# Patient Record
Sex: Female | Born: 2000 | Race: Black or African American | Hispanic: No | Marital: Single | Smoking: Never smoker
Health system: Southern US, Community
[De-identification: ages and names within clinical notes are randomized; demographics above are authoritative.]

## PROBLEM LIST (undated history)

## (undated) DIAGNOSIS — J302 Other seasonal allergic rhinitis: Secondary | ICD-10-CM

---
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
## 2022-04-10 DIAGNOSIS — Z79899 Other long term (current) drug therapy: Secondary | ICD-10-CM | POA: Diagnosis not present

## 2022-04-10 DIAGNOSIS — L700 Acne vulgaris: Secondary | ICD-10-CM | POA: Diagnosis not present

## 2022-04-28 DIAGNOSIS — Z79899 Other long term (current) drug therapy: Secondary | ICD-10-CM | POA: Diagnosis not present

## 2022-05-09 DIAGNOSIS — H66001 Acute suppurative otitis media without spontaneous rupture of ear drum, right ear: Secondary | ICD-10-CM | POA: Diagnosis not present

## 2022-05-09 DIAGNOSIS — Z6833 Body mass index (BMI) 33.0-33.9, adult: Secondary | ICD-10-CM | POA: Diagnosis not present

## 2022-05-09 DIAGNOSIS — J029 Acute pharyngitis, unspecified: Secondary | ICD-10-CM | POA: Diagnosis not present

## 2022-05-12 DIAGNOSIS — Z79899 Other long term (current) drug therapy: Secondary | ICD-10-CM | POA: Diagnosis not present

## 2022-05-12 DIAGNOSIS — L700 Acne vulgaris: Secondary | ICD-10-CM | POA: Diagnosis not present

## 2022-06-09 DIAGNOSIS — L700 Acne vulgaris: Secondary | ICD-10-CM | POA: Diagnosis not present

## 2022-06-09 DIAGNOSIS — Z79899 Other long term (current) drug therapy: Secondary | ICD-10-CM | POA: Diagnosis not present

## 2022-07-10 DIAGNOSIS — L700 Acne vulgaris: Secondary | ICD-10-CM | POA: Diagnosis not present

## 2022-07-10 DIAGNOSIS — Z79899 Other long term (current) drug therapy: Secondary | ICD-10-CM | POA: Diagnosis not present

## 2022-07-20 DIAGNOSIS — Z79899 Other long term (current) drug therapy: Secondary | ICD-10-CM | POA: Diagnosis not present

## 2022-08-05 DIAGNOSIS — L700 Acne vulgaris: Secondary | ICD-10-CM | POA: Diagnosis not present

## 2022-08-05 DIAGNOSIS — L218 Other seborrheic dermatitis: Secondary | ICD-10-CM | POA: Diagnosis not present

## 2022-08-05 DIAGNOSIS — Z79899 Other long term (current) drug therapy: Secondary | ICD-10-CM | POA: Diagnosis not present

## 2022-09-03 DIAGNOSIS — L853 Xerosis cutis: Secondary | ICD-10-CM | POA: Diagnosis not present

## 2022-09-03 DIAGNOSIS — L700 Acne vulgaris: Secondary | ICD-10-CM | POA: Diagnosis not present

## 2022-09-03 DIAGNOSIS — Z79899 Other long term (current) drug therapy: Secondary | ICD-10-CM | POA: Diagnosis not present

## 2022-10-05 DIAGNOSIS — L732 Hidradenitis suppurativa: Secondary | ICD-10-CM | POA: Diagnosis not present

## 2022-10-05 DIAGNOSIS — Z79899 Other long term (current) drug therapy: Secondary | ICD-10-CM | POA: Diagnosis not present

## 2022-10-05 DIAGNOSIS — L853 Xerosis cutis: Secondary | ICD-10-CM | POA: Diagnosis not present

## 2022-10-05 DIAGNOSIS — L700 Acne vulgaris: Secondary | ICD-10-CM | POA: Diagnosis not present

## 2022-10-23 DIAGNOSIS — R7309 Other abnormal glucose: Secondary | ICD-10-CM | POA: Diagnosis not present

## 2022-10-23 DIAGNOSIS — Z9109 Other allergy status, other than to drugs and biological substances: Secondary | ICD-10-CM | POA: Diagnosis not present

## 2022-10-23 DIAGNOSIS — Z13220 Encounter for screening for lipoid disorders: Secondary | ICD-10-CM | POA: Diagnosis not present

## 2022-10-23 DIAGNOSIS — L709 Acne, unspecified: Secondary | ICD-10-CM | POA: Diagnosis not present

## 2022-10-23 DIAGNOSIS — Z79899 Other long term (current) drug therapy: Secondary | ICD-10-CM | POA: Diagnosis not present

## 2022-10-23 DIAGNOSIS — Z0000 Encounter for general adult medical examination without abnormal findings: Secondary | ICD-10-CM | POA: Diagnosis not present

## 2022-10-23 DIAGNOSIS — R946 Abnormal results of thyroid function studies: Secondary | ICD-10-CM | POA: Diagnosis not present

## 2022-10-28 DIAGNOSIS — Z91018 Allergy to other foods: Secondary | ICD-10-CM | POA: Diagnosis not present

## 2022-10-28 DIAGNOSIS — J3089 Other allergic rhinitis: Secondary | ICD-10-CM | POA: Diagnosis not present

## 2022-10-28 DIAGNOSIS — J3081 Allergic rhinitis due to animal (cat) (dog) hair and dander: Secondary | ICD-10-CM | POA: Diagnosis not present

## 2022-10-28 DIAGNOSIS — J301 Allergic rhinitis due to pollen: Secondary | ICD-10-CM | POA: Diagnosis not present

## 2022-11-04 DIAGNOSIS — Z79899 Other long term (current) drug therapy: Secondary | ICD-10-CM | POA: Diagnosis not present

## 2022-11-04 DIAGNOSIS — L732 Hidradenitis suppurativa: Secondary | ICD-10-CM | POA: Diagnosis not present

## 2022-11-04 DIAGNOSIS — L700 Acne vulgaris: Secondary | ICD-10-CM | POA: Diagnosis not present

## 2022-12-09 DIAGNOSIS — Z79899 Other long term (current) drug therapy: Secondary | ICD-10-CM | POA: Diagnosis not present

## 2022-12-09 DIAGNOSIS — L700 Acne vulgaris: Secondary | ICD-10-CM | POA: Diagnosis not present

## 2022-12-09 DIAGNOSIS — L720 Epidermal cyst: Secondary | ICD-10-CM | POA: Diagnosis not present

## 2022-12-09 DIAGNOSIS — L74510 Primary focal hyperhidrosis, axilla: Secondary | ICD-10-CM | POA: Diagnosis not present

## 2022-12-19 DIAGNOSIS — M79671 Pain in right foot: Secondary | ICD-10-CM | POA: Diagnosis not present

## 2023-02-05 DIAGNOSIS — Z111 Encounter for screening for respiratory tuberculosis: Secondary | ICD-10-CM | POA: Diagnosis not present

## 2023-02-08 DIAGNOSIS — Z111 Encounter for screening for respiratory tuberculosis: Secondary | ICD-10-CM | POA: Diagnosis not present

## 2023-02-08 DIAGNOSIS — Z681 Body mass index (BMI) 19 or less, adult: Secondary | ICD-10-CM | POA: Diagnosis not present

## 2023-02-08 DIAGNOSIS — Z021 Encounter for pre-employment examination: Secondary | ICD-10-CM | POA: Diagnosis not present

## 2023-02-08 DIAGNOSIS — Z6835 Body mass index (BMI) 35.0-35.9, adult: Secondary | ICD-10-CM | POA: Diagnosis not present

## 2023-05-22 DIAGNOSIS — H66002 Acute suppurative otitis media without spontaneous rupture of ear drum, left ear: Secondary | ICD-10-CM | POA: Diagnosis not present

## 2023-05-22 DIAGNOSIS — Z6833 Body mass index (BMI) 33.0-33.9, adult: Secondary | ICD-10-CM | POA: Diagnosis not present

## 2023-05-22 DIAGNOSIS — Z23 Encounter for immunization: Secondary | ICD-10-CM | POA: Diagnosis not present

## 2023-05-22 DIAGNOSIS — R051 Acute cough: Secondary | ICD-10-CM | POA: Diagnosis not present

## 2023-06-11 NOTE — Progress Notes (Deleted)
  [facility]ARE [facility] [address] [place] [address] Dept: [phone] Dept Fax: [phone]  New Patient Office Visit  Subjective:   Christie Obrien [birth year] 06/14/2023  No chief complaint on file.   HPI: Christie Obrien presents today to establish care at [facility]. Introduced to publishing rights manager role and practice setting.  All questions answered.  Concerns: See below   Discussed the use of AI scribe software for clinical note transcription with the patient, who gave verbal consent to proceed.  History of Present Illness             The following portions of the patient's history were reviewed and updated as appropriate: past medical history, past surgical history, family history, social history, allergies, medications, and problem list.   There are no active problems to display for this patient.  Past Medical History:  Diagnosis Date   Seasonal allergies    No past surgical history on file. No family history on file.  Current Outpatient Medications:    Cetirizine HCl (ZYRTEC PO), Take by mouth., Disp: , Rfl:    EPINEPHrine 0.3 mg/0.3 mL IJ SOAJ injection, Inject into the muscle once., Disp: , Rfl:    fluconazole  (DIFLUCAN ) 150 MG tablet, Take 1 capsule as needed for vaginal yeast infection.  May repeat in 3 days if symptoms persist., Disp: 2 tablet, Rfl: 0   meloxicam (MOBIC) 7.5 MG tablet, Take 7.5 mg by mouth daily., Disp: , Rfl:    Montelukast Sodium (SINGULAIR PO), Take by mouth., Disp: , Rfl:  Allergies  Allergen Reactions   Other     Tree nuts   Peanut-Containing Drug Products     hives    ROS: A complete ROS was performed with pertinent positives/negatives noted in the HPI. The remainder of the ROS are negative.   Objective:   There were no vitals filed for this visit.  GENERAL: Well-appearing, in NAD. Well nourished.  SKIN: Pink, warm and dry. No rash, lesion,  ulceration, or ecchymoses.  NECK: Trachea midline. Full ROM w/o pain or tenderness. No lymphadenopathy.  RESPIRATORY: Chest wall symmetrical. Respirations even and non-labored. Breath sounds clear to auscultation bilaterally.  CARDIAC: S1, S2 present, regular rate and rhythm. Peripheral pulses 2+ bilaterally.  MSK: Muscle tone and strength appropriate for age. Joints w/o tenderness, redness, or swelling.  EXTREMITIES: Without clubbing, cyanosis, or edema.  NEUROLOGIC: No motor or sensory deficits. Steady, even gait.  PSYCH/MENTAL STATUS: Alert, oriented x 3. Cooperative, appropriate mood and affect.   Health Maintenance Due  Topic Date Due   COVID-19 Vaccine (1) Never done   HPV VACCINES (1 - 3-dose series) Never done   HIV Screening  Never done   Hepatitis C Screening  Never done   DTaP/Tdap/Td (1 - Tdap) Never done    No results found for any visits on 06/14/23.  Assessment & Plan:  Assessment and Plan               There are no diagnoses linked to this encounter.  No orders of the defined types were placed in this encounter.  No orders of the defined types were placed in this encounter.   No follow-ups on file.   Rosina Senters, FNP

## 2023-06-14 ENCOUNTER — Ambulatory Visit: Payer: Federal, State, Local not specified - PPO | Admitting: Internal Medicine

## 2023-08-17 ENCOUNTER — Encounter: Payer: Self-pay | Admitting: Internal Medicine

## 2023-08-17 ENCOUNTER — Ambulatory Visit: Payer: Federal, State, Local not specified - PPO | Admitting: Internal Medicine

## 2023-08-17 VITALS — BP 108/80 | HR 93 | Temp 98.0°F | Ht 63.0 in | Wt 192.8 lb

## 2023-08-17 DIAGNOSIS — J302 Other seasonal allergic rhinitis: Secondary | ICD-10-CM | POA: Diagnosis not present

## 2023-08-17 DIAGNOSIS — Z91010 Allergy to peanuts: Secondary | ICD-10-CM | POA: Insufficient documentation

## 2023-08-17 DIAGNOSIS — E669 Obesity, unspecified: Secondary | ICD-10-CM | POA: Diagnosis not present

## 2023-08-17 NOTE — Progress Notes (Signed)
 [facility] [address] [place] [address] Dept: [phone] Dept Fax: [phone]  New Patient Office Visit  Subjective:   Christie Obrien April 02, 2001 08/17/2023  Chief Complaint  Patient presents with   Establish Care    HPI: Christie Obrien presents today to establish care at [facility]. Introduced to Publishing rights manager role and practice setting.  All questions answered.  Concerns: See below   Discussed the use of AI scribe software for clinical note transcription with the patient, who gave verbal consent to proceed.  History of Present Illness   The patient, with a history of seasonal allergies and peanut allergy, presents to establish care. She is currently managed with Zyrtec and Singulair for her seasonal allergies and has an EpiPen for her peanut allergy. She sees an allergist regularly for these conditions. She has no history of surgeries. She does not smoke. She has been focusing on weight loss, losing eight pounds since January through diet and exercise. She is satisfied with her progress and plans to continue her current regimen.       The following portions of the patient's history were reviewed and updated as appropriate: past medical history, past surgical history, family history, social history, allergies, medications, and problem list.   There are no active problems to display for this patient.  Past Medical History:  Diagnosis Date   Seasonal allergies    History reviewed. No pertinent surgical history. History reviewed. No pertinent family history.  Current Outpatient Medications:    Cetirizine HCl (ZYRTEC PO), Take by mouth., Disp: , Rfl:    EPINEPHrine 0.3 mg/0.3 mL IJ SOAJ injection, Inject into the muscle once., Disp: , Rfl:    Montelukast Sodium (SINGULAIR PO), Take by mouth., Disp: , Rfl:  Allergies  Allergen Reactions   Other     Tree nuts   Peanut-Containing  Drug Products     hives    ROS: A complete ROS was performed with pertinent positives/negatives noted in the HPI. The remainder of the ROS are negative.   Objective:   Today's Vitals   08/17/23 0900  BP: 108/80  Pulse: 93  Temp: 98 F (36.7 C)  TempSrc: Temporal  SpO2: 100%  Weight: 192 lb 12.8 oz (87.5 kg)  Height: 5\' 3"  (1.6 m)    GENERAL: Well-appearing, in NAD. Well nourished.  SKIN: Pink, warm and dry. No rash, lesion, ulceration, or ecchymoses.  NECK: Trachea midline. Full ROM w/o pain or tenderness. No lymphadenopathy.  RESPIRATORY: Chest wall symmetrical. Respirations even and non-labored. Breath sounds clear to auscultation bilaterally.  CARDIAC: S1, S2 present, regular rate and rhythm. Peripheral pulses 2+ bilaterally.  EXTREMITIES: Without clubbing, cyanosis, or edema.  NEUROLOGIC: Steady, even gait.  PSYCH/MENTAL STATUS: Alert, oriented x 3. Cooperative, appropriate mood and affect.   Health Maintenance Due  Topic Date Due   HIV Screening  Never done   Hepatitis C Screening  Never done    No results found for any visits on 08/17/23.  Assessment & Plan:  Assessment and Plan    Obesity  - Continue healthy diet and regular exercise  - Discussed gradual weight loss and healthy lifestyle    Seasonal Allergies Seasonal allergies controlled with Zyrtec and Singulair. No exacerbations. - Continue Zyrtec. - Continue Singulair. - Follow up with allergist.  Peanut Allergy Peanut allergy managed with EpiPen. No recent reactions. - Epipen is not expired. No refill needed.  - Follow up with allergist in May.  No orders of the defined types were placed in this encounter.  No orders of the defined types were placed in this encounter.   Return in about 3 months (around 11/17/2023) for Annual Physical Exam with fasting lab work.   Christie Decent, FNP

## 2023-10-19 DIAGNOSIS — J3089 Other allergic rhinitis: Secondary | ICD-10-CM | POA: Diagnosis not present

## 2023-10-19 DIAGNOSIS — J301 Allergic rhinitis due to pollen: Secondary | ICD-10-CM | POA: Diagnosis not present

## 2023-10-19 DIAGNOSIS — Z91018 Allergy to other foods: Secondary | ICD-10-CM | POA: Diagnosis not present

## 2023-10-19 DIAGNOSIS — J3081 Allergic rhinitis due to animal (cat) (dog) hair and dander: Secondary | ICD-10-CM | POA: Diagnosis not present

## 2023-11-17 ENCOUNTER — Encounter: Payer: Self-pay | Admitting: Internal Medicine

## 2023-11-17 ENCOUNTER — Ambulatory Visit (INDEPENDENT_AMBULATORY_CARE_PROVIDER_SITE_OTHER): Admitting: Internal Medicine

## 2023-11-17 VITALS — BP 116/66 | HR 84 | Temp 98.1°F | Ht 63.0 in | Wt 186.0 lb

## 2023-11-17 DIAGNOSIS — Z0000 Encounter for general adult medical examination without abnormal findings: Secondary | ICD-10-CM

## 2023-11-17 DIAGNOSIS — T753XXA Motion sickness, initial encounter: Secondary | ICD-10-CM

## 2023-11-17 DIAGNOSIS — Z23 Encounter for immunization: Secondary | ICD-10-CM

## 2023-11-17 MED ORDER — ONDANSETRON 4 MG PO TBDP: 4.0000 mg | ORAL_TABLET | Freq: Three times a day (TID) | ORAL | 0 refills | Status: AC | PRN

## 2023-11-17 NOTE — Progress Notes (Signed)
 Subjective:   Christie Obrien 12-27-2000  11/17/2023   CC: Chief Complaint  Patient presents with   Annual Exam    HPI: Christie Obrien is a 23 y.o. female who presents for a routine health maintenance exam.  Labs collected at time of visit.   Patient reports motion sickness on long car rides. She is going out of town later this month. Will be using car and airline transportation. Would like to know what to take for motion sickness.   HEALTH SCREENINGS: - Pap smear: declined today  - Mammogram (40+): Not applicable  - Colonoscopy (45+): Not applicable  - Bone Density (65+): Not applicable  - Lung CA screening with low-dose CT:  Not applicable Adults age 60-80 who are current cigarette smokers or quit within the last 15 years. Must have 20 pack year history.   Depression and Anxiety Screen done today and results listed below:     08/17/2023    9:22 AM  Depression screen PHQ 2/9  Decreased Interest 0  Down, Depressed, Hopeless 0  PHQ - 2 Score 0  Altered sleeping 0  Tired, decreased energy 0  Change in appetite 0  Feeling bad or failure about yourself  0  Trouble concentrating 0  Moving slowly or fidgety/restless 0  Suicidal thoughts 0  PHQ-9 Score 0  Difficult doing work/chores Not difficult at all      08/17/2023    9:22 AM  GAD 7 : Generalized Anxiety Score  Nervous, Anxious, on Edge 0  Control/stop worrying 1  Worry too much - different things 1  Trouble relaxing 0  Restless 0  Easily annoyed or irritable 0  Afraid - awful might happen 0  Total GAD 7 Score 2  Anxiety Difficulty Not difficult at all    IMMUNIZATIONS: - Tdap: Tetanus vaccination status reviewed: Td vaccination indicated and given today. - HPV: Up to date - Influenza: Up to date   Past medical history, surgical history, medications, allergies, family history and social history reviewed with patient today and changes made to appropriate areas of the chart.   Past Medical History:   Diagnosis Date   Seasonal allergies     History reviewed. No pertinent surgical history.  Current Outpatient Medications on File Prior to Visit  Medication Sig   Cetirizine HCl (ZYRTEC PO) Take by mouth.   EPINEPHrine 0.3 mg/0.3 mL IJ SOAJ injection Inject into the muscle once.   Montelukast Sodium (SINGULAIR PO) Take by mouth.   Oxymetazoline HCl (NASAL SPRAY NA) Place 1 spray into the nose at bedtime.   spironolactone (ALDACTONE) 50 MG tablet Take 50 mg by mouth daily.   No current facility-administered medications on file prior to visit.    Allergies  Allergen Reactions   Other     Tree nuts   Peanut-Containing Drug Products     hives     Social History   Socioeconomic History   Marital status: Single    Spouse name: Not on file   Number of children: Not on file   Years of education: Not on file   Highest education level: Some college, no degree  Occupational History   Not on file  Tobacco Use   Smoking status: Never   Smokeless tobacco: Never  Vaping Use   Vaping status: Never Used  Substance and Sexual Activity   Alcohol use: Yes    Comment: rarely   Drug use: Never   Sexual activity: Never  Other Topics Concern   Not on  file  Social History Narrative   Not on file   Social Drivers of Health   Financial Resource Strain: Low Risk  (06/11/2023)   Overall Financial Resource Strain (CARDIA)    Difficulty of Paying Living Expenses: Not hard at all  Food Insecurity: No Food Insecurity (06/11/2023)   Hunger Vital Sign    Worried About Running Out of Food in the Last Year: Never true    Ran Out of Food in the Last Year: Never true  Transportation Needs: No Transportation Needs (06/11/2023)   PRAPARE - Administrator, Civil Service (Medical): No    Lack of Transportation (Non-Medical): No  Physical Activity: Insufficiently Active (06/11/2023)   Exercise Vital Sign    Days of Exercise per Week: 5 days    Minutes of Exercise per Session: 20 min   Stress: Stress Concern Present (06/11/2023)   [employer] of Occupational Health - Occupational Stress Questionnaire    Feeling of Stress : To some extent  Social Connections: Moderately Integrated (06/11/2023)   Social Connection and Isolation Panel [NHANES]    Frequency of Communication with Friends and Family: More than three times a week    Frequency of Social Gatherings with Friends and Family: Once a week    Attends Religious Services: More than 4 times per year    Active Member of [employer] or Organizations: Yes    Attends Engineer, structural: More than 4 times per year    Marital Status: Never married  Catering manager Violence: Not on file   Social History   Tobacco Use  Smoking Status Never  Smokeless Tobacco Never   Social History   Substance and Sexual Activity  Alcohol Use Yes   Comment: rarely    History reviewed. No pertinent family history.   ROS: Denies fever, fatigue, unexplained weight loss/gain, hearing or vision changes, cardiac or respiratory complaints. Denies neurological deficits, musculoskeletal complaints, gastrointestinal or genitourinary complaints, mental health complaints, and skin changes.   Objective:   Today's Vitals   11/17/23 0857  BP: 116/66  Pulse: 84  Temp: 98.1 F (36.7 C)  TempSrc: Oral  SpO2: 98%  Weight: 186 lb (84.4 kg)  Height: 5' 3 (1.6 m)    GENERAL APPEARANCE: Well-appearing, in NAD. Well nourished.  SKIN: Pink, warm and dry. Turgor normal. No rash, lesion, ulceration, or ecchymoses. Hair evenly distributed.  HEENT: HEAD: Normocephalic.  EYES: PERRLA. EOMI. Lids intact w/o defect. Sclera white, Conjunctiva pink w/o exudate.  EARS: External ear w/o redness, swelling, masses or lesions. EAC clear. TM's intact, translucent w/o bulging, appropriate landmarks visualized. Appropriate acuity to conversational tones.  NOSE: Septum midline w/o deformity. Nares patent, mucosa pink and non-inflamed w/o drainage.   THROAT: Uvula midline. Oropharynx clear. Tonsils non-inflamed w/o exudate. Oral mucosa pink and moist.  NECK: Supple, Trachea midline. Full ROM w/o pain or tenderness. No lymphadenopathy. Thyroid non-tender w/o enlargement or palpable masses.  RESPIRATORY: Chest wall symmetrical w/o masses. Respirations even and non-labored. Breath sounds clear to auscultation bilaterally. No wheezes, rales, rhonchi, or crackles. CARDIAC: S1, S2 present, regular rate and rhythm. No gallops, murmurs, rubs, or clicks. Capillary refill <2 seconds. Peripheral pulses 2+ bilaterally. GI: Abdomen soft w/o distention. Normoactive bowel sounds. No palpable masses or tenderness. No guarding or rebound tenderness. Liver and spleen w/o tenderness or enlargement. No CVA tenderness.  MSK: Muscle tone and strength appropriate for age, w/o atrophy or abnormal movement.  EXTREMITIES: Active ROM intact, w/o tenderness, crepitus, or contracture. No obvious  joint deformities or effusions. No clubbing, edema, or cyanosis.  NEUROLOGIC: CN's II-XII intact. Motor strength symmetrical with no obvious weakness. No sensory deficits. Steady, even gait.  PSYCH/MENTAL STATUS: Alert, oriented x 3. Cooperative, appropriate mood and affect.    Assessment & Plan:  1. Encounter for general adult medical examination without abnormal findings (Primary) - CBC with Differential/Platelet - Comprehensive metabolic panel with GFR - TSH - Lipid panel  2. Immunization due - Tdap vaccine greater than or equal to 7yo IM  3. Motion sickness, initial encounter - discussed trying OTC Dramamine less drowsy. If nausea still persists - can take Zofran .  - ondansetron  (ZOFRAN -ODT) 4 MG disintegrating tablet; Take 1 tablet (4 mg total) by mouth every 8 (eight) hours as needed for nausea or vomiting.  Dispense: 20 tablet; Refill: 0    Orders Placed This Encounter  Procedures   Tdap vaccine greater than or equal to 7yo IM   CBC with Differential/Platelet    Comprehensive metabolic panel with GFR   TSH   Lipid panel    PATIENT COUNSELING:  - Encouraged a healthy well-balanced diet. Patient may adjust caloric intake to maintain or achieve ideal body weight. May reduce intake of dietary saturated fat and total fat and have adequate dietary potassium and calcium preferably from fresh fruits, vegetables, and low-fat dairy products.   - Advised to avoid cigarette smoking. - Discussed with the patient that most people either abstain from alcohol or drink within safe limits (<=14/week and <=4 drinks/occasion for males, <=7/weeks and <= 3 drinks/occasion for females) and that the risk for alcohol disorders and other health effects rises proportionally with the number of drinks per week and how often a drinker exceeds daily limits. - Discussed cessation/primary prevention of drug use and availability of treatment for abuse.  - Discussed sexually transmitted diseases, avoidance of unintended pregnancy and contraceptive alternatives.  - Stressed the importance of regular exercise - Injury prevention: Discussed safety belts, safety helmets, smoke detector, smoking near bedding or upholstery.  - Dental health: Discussed importance of regular tooth brushing, flossing, and dental visits.   NEXT PREVENTATIVE PHYSICAL DUE IN 1 YEAR.  Return in about 1 year (around 11/16/2024) for Annual Physical Exam with fasting lab work.  Gavin Kast, FNP

## 2023-11-17 NOTE — Patient Instructions (Signed)
 Over the counter: Dramamine Less Drowsy

## 2023-11-18 ENCOUNTER — Ambulatory Visit: Payer: Self-pay | Admitting: Internal Medicine

## 2023-11-18 LAB — COMPREHENSIVE METABOLIC PANEL WITH GFR
ALT: 7 IU/L (ref 0–32)
AST: 13 IU/L (ref 0–40)
Albumin: 4.6 g/dL (ref 4.0–5.0)
Alkaline Phosphatase: 64 IU/L (ref 44–121)
BUN/Creatinine Ratio: 20 (ref 9–23)
BUN: 14 mg/dL (ref 6–20)
Bilirubin Total: 0.8 mg/dL (ref 0.0–1.2)
CO2: 19 mmol/L — ABNORMAL LOW (ref 20–29)
Calcium: 9.3 mg/dL (ref 8.7–10.2)
Chloride: 105 mmol/L (ref 96–106)
Creatinine, Ser: 0.7 mg/dL (ref 0.57–1.00)
Globulin, Total: 2.9 g/dL (ref 1.5–4.5)
Glucose: 85 mg/dL (ref 70–99)
Potassium: 4 mmol/L (ref 3.5–5.2)
Sodium: 140 mmol/L (ref 134–144)
Total Protein: 7.5 g/dL (ref 6.0–8.5)
eGFR: 126 mL/min/{1.73_m2} (ref >59)

## 2023-11-18 LAB — CBC WITH DIFFERENTIAL/PLATELET
Basophils Absolute: 0.1 10*3/uL (ref 0.0–0.2)
Basos: 1 %
EOS (ABSOLUTE): 0.2 10*3/uL (ref 0.0–0.4)
Eos: 3 %
Hematocrit: 41.8 % (ref 34.0–46.6)
Hemoglobin: 13.6 g/dL (ref 11.1–15.9)
Immature Grans (Abs): 0 10*3/uL (ref 0.0–0.1)
Immature Granulocytes: 0 %
Lymphocytes Absolute: 2.1 10*3/uL (ref 0.7–3.1)
Lymphs: 34 %
MCH: 29.1 pg (ref 26.6–33.0)
MCHC: 32.5 g/dL (ref 31.5–35.7)
MCV: 90 fL (ref 79–97)
Monocytes Absolute: 0.6 10*3/uL (ref 0.1–0.9)
Monocytes: 10 %
Neutrophils Absolute: 3.2 10*3/uL (ref 1.4–7.0)
Neutrophils: 52 %
Platelets: 303 10*3/uL (ref 150–450)
RBC: 4.67 x10E6/uL (ref 3.77–5.28)
RDW: 14.3 % (ref 11.7–15.4)
WBC: 6.2 10*3/uL (ref 3.4–10.8)

## 2023-11-18 LAB — LIPID PANEL
Chol/HDL Ratio: 2.5 ratio (ref 0.0–4.4)
Cholesterol, Total: 134 mg/dL (ref 100–199)
HDL: 54 mg/dL (ref >39)
LDL Chol Calc (NIH): 62 mg/dL (ref 0–99)
Triglycerides: 99 mg/dL (ref 0–149)
VLDL Cholesterol Cal: 18 mg/dL (ref 5–40)

## 2023-11-18 LAB — TSH: TSH: 1.87 u[IU]/mL (ref 0.450–4.500)

## 2024-01-18 DIAGNOSIS — L700 Acne vulgaris: Secondary | ICD-10-CM | POA: Diagnosis not present

## 2024-01-18 DIAGNOSIS — L720 Epidermal cyst: Secondary | ICD-10-CM | POA: Diagnosis not present

## 2024-01-18 DIAGNOSIS — L218 Other seborrheic dermatitis: Secondary | ICD-10-CM | POA: Diagnosis not present

## 2024-11-17 ENCOUNTER — Encounter: Admitting: Internal Medicine
